# Patient Record
Sex: Female | Born: 1960 | Race: White | Hispanic: No | Marital: Married | Smoking: Never smoker
Health system: Southern US, Community
[De-identification: ages and names within clinical notes are randomized; demographics above are authoritative.]

---

## 2004-11-30 ENCOUNTER — Other Ambulatory Visit: Admission: RE | Admit: 2004-11-30 | Discharge: 2004-11-30 | Payer: Self-pay | Admitting: Obstetrics and Gynecology

## 2004-12-05 ENCOUNTER — Ambulatory Visit (HOSPITAL_COMMUNITY): Admission: RE | Admit: 2004-12-05 | Discharge: 2004-12-05 | Payer: Self-pay | Admitting: Obstetrics and Gynecology

## 2007-07-01 ENCOUNTER — Ambulatory Visit (HOSPITAL_COMMUNITY): Admission: RE | Admit: 2007-07-01 | Discharge: 2007-07-01 | Payer: Self-pay | Admitting: Obstetrics and Gynecology

## 2009-02-08 ENCOUNTER — Other Ambulatory Visit: Admission: RE | Admit: 2009-02-08 | Discharge: 2009-02-08 | Payer: Self-pay | Admitting: Obstetrics and Gynecology

## 2009-02-09 ENCOUNTER — Encounter: Admission: RE | Admit: 2009-02-09 | Discharge: 2009-02-09 | Payer: Self-pay | Admitting: Obstetrics and Gynecology

## 2010-02-13 ENCOUNTER — Other Ambulatory Visit: Admission: RE | Admit: 2010-02-13 | Discharge: 2010-02-13 | Payer: Self-pay | Admitting: Obstetrics and Gynecology

## 2010-03-09 ENCOUNTER — Ambulatory Visit (HOSPITAL_COMMUNITY): Admission: RE | Admit: 2010-03-09 | Discharge: 2010-03-09 | Payer: Self-pay | Admitting: Obstetrics and Gynecology

## 2010-04-12 ENCOUNTER — Ambulatory Visit: Payer: Self-pay | Admitting: Sports Medicine

## 2010-04-12 DIAGNOSIS — M7660 Achilles tendinitis, unspecified leg: Secondary | ICD-10-CM | POA: Insufficient documentation

## 2010-09-05 NOTE — Assessment & Plan Note (Signed)
Summary: NP RUNNER L  ACHILLES INJURY X JULY   Vital Signs:  Patient profile:   50 year old female Height:      68 inches Weight:      135 pounds BMI:     20.60 BP sitting:   133 / 84  Vitals Entered By: Lillia Pauls CMA (April 12, 2010 2:55 PM)  History of Present Illness: RT leg AT about 6 mos ago and took 12 wks of exercises to resolve Within 6 wks had some RT PF pain that took 2 mos to settle down  Runs 20 to 30 mpw Bikes - 7 to 8 hours  Mid to late jul had left AT pain now pain is mild coming to see me to see why more injuries w no change in training  Physical Exam  General:  Well-developed,well-nourished,in no acute distress; alert,appropriate and cooperative throughout examination Msk:  Exc quad. HS and calf strength both AT are normal and nontender to palpation mild foot pronation but more over great toe some 1 mtp joint change bilat  weak hip abductors/ good flex and rotation  gait dynamic pronation of fore and mid foot dynamic genu valgum is mild  improved in sports insole with heel lift   Impression & Recommendations:  Problem # 1:  ACHILLES TENDINITIS, BILATERAL (ICD-726.71)  Mild on both sides at present  I think this is trgigered by biomechanics - needs abdcution exercises and a program given for this also nees some arch support to block pronation given sports insolse  reck 6 wks and see is strength gain noted  restart AT exercises  Orders: Sports Insoles ([id number])

## 2011-04-11 ENCOUNTER — Other Ambulatory Visit (HOSPITAL_COMMUNITY): Payer: Self-pay | Admitting: Obstetrics and Gynecology

## 2011-04-11 DIAGNOSIS — Z1231 Encounter for screening mammogram for malignant neoplasm of breast: Secondary | ICD-10-CM

## 2011-04-13 ENCOUNTER — Ambulatory Visit (HOSPITAL_COMMUNITY)
Admission: RE | Admit: 2011-04-13 | Discharge: 2011-04-13 | Disposition: A | Discharge status: Home / Self Care | Payer: BC Managed Care – PPO | Source: Ambulatory Visit | Attending: Obstetrics and Gynecology | Admitting: Obstetrics and Gynecology

## 2011-04-13 DIAGNOSIS — Z1231 Encounter for screening mammogram for malignant neoplasm of breast: Secondary | ICD-10-CM | POA: Insufficient documentation

## 2011-04-17 ENCOUNTER — Other Ambulatory Visit: Payer: Self-pay | Admitting: Obstetrics and Gynecology

## 2011-04-17 DIAGNOSIS — R928 Other abnormal and inconclusive findings on diagnostic imaging of breast: Secondary | ICD-10-CM

## 2011-04-25 ENCOUNTER — Ambulatory Visit
Admission: RE | Admit: 2011-04-25 | Discharge: 2011-04-25 | Disposition: A | Discharge status: Home / Self Care | Payer: BC Managed Care – PPO | Source: Ambulatory Visit | Attending: Obstetrics and Gynecology | Admitting: Obstetrics and Gynecology

## 2011-04-25 DIAGNOSIS — R928 Other abnormal and inconclusive findings on diagnostic imaging of breast: Secondary | ICD-10-CM

## 2011-10-05 ENCOUNTER — Other Ambulatory Visit: Payer: Self-pay | Admitting: Obstetrics and Gynecology

## 2011-10-05 DIAGNOSIS — N6089 Other benign mammary dysplasias of unspecified breast: Secondary | ICD-10-CM

## 2011-10-17 ENCOUNTER — Other Ambulatory Visit: Payer: Self-pay | Admitting: Obstetrics and Gynecology

## 2011-10-17 ENCOUNTER — Ambulatory Visit
Admission: RE | Admit: 2011-10-17 | Discharge: 2011-10-17 | Disposition: A | Discharge status: Home / Self Care | Payer: BC Managed Care – PPO | Source: Ambulatory Visit | Attending: Obstetrics and Gynecology | Admitting: Obstetrics and Gynecology

## 2011-10-17 DIAGNOSIS — N6089 Other benign mammary dysplasias of unspecified breast: Secondary | ICD-10-CM

## 2012-02-19 ENCOUNTER — Other Ambulatory Visit: Payer: Self-pay | Admitting: Obstetrics and Gynecology

## 2012-02-19 ENCOUNTER — Other Ambulatory Visit (HOSPITAL_COMMUNITY)
Admission: RE | Admit: 2012-02-19 | Discharge: 2012-02-19 | Disposition: A | Discharge status: Home / Self Care | Payer: BC Managed Care – PPO | Source: Ambulatory Visit | Attending: Obstetrics and Gynecology | Admitting: Obstetrics and Gynecology

## 2012-02-19 DIAGNOSIS — Z01419 Encounter for gynecological examination (general) (routine) without abnormal findings: Secondary | ICD-10-CM | POA: Insufficient documentation

## 2012-02-19 DIAGNOSIS — Z1159 Encounter for screening for other viral diseases: Secondary | ICD-10-CM | POA: Insufficient documentation

## 2013-02-23 ENCOUNTER — Other Ambulatory Visit: Payer: Self-pay | Admitting: Obstetrics and Gynecology

## 2013-02-23 ENCOUNTER — Other Ambulatory Visit (HOSPITAL_COMMUNITY)
Admission: RE | Admit: 2013-02-23 | Discharge: 2013-02-23 | Disposition: A | Discharge status: Home / Self Care | Payer: Managed Care, Other (non HMO) | Source: Ambulatory Visit | Attending: Obstetrics and Gynecology | Admitting: Obstetrics and Gynecology

## 2013-02-23 DIAGNOSIS — Z01419 Encounter for gynecological examination (general) (routine) without abnormal findings: Secondary | ICD-10-CM | POA: Insufficient documentation

## 2013-02-26 ENCOUNTER — Other Ambulatory Visit: Payer: Self-pay | Admitting: Obstetrics and Gynecology

## 2013-02-26 DIAGNOSIS — E041 Nontoxic single thyroid nodule: Secondary | ICD-10-CM

## 2013-03-03 ENCOUNTER — Ambulatory Visit
Admission: RE | Admit: 2013-03-03 | Discharge: 2013-03-03 | Disposition: A | Discharge status: Home / Self Care | Payer: Managed Care, Other (non HMO) | Source: Ambulatory Visit | Attending: Obstetrics and Gynecology | Admitting: Obstetrics and Gynecology

## 2013-03-03 DIAGNOSIS — E041 Nontoxic single thyroid nodule: Secondary | ICD-10-CM

## 2013-08-26 ENCOUNTER — Other Ambulatory Visit: Payer: Self-pay | Admitting: Gastroenterology

## 2013-08-28 ENCOUNTER — Other Ambulatory Visit (HOSPITAL_COMMUNITY): Payer: Self-pay | Admitting: Obstetrics and Gynecology

## 2013-08-28 DIAGNOSIS — Z1231 Encounter for screening mammogram for malignant neoplasm of breast: Secondary | ICD-10-CM

## 2013-09-03 ENCOUNTER — Ambulatory Visit (HOSPITAL_COMMUNITY)
Admission: RE | Admit: 2013-09-03 | Discharge: 2013-09-03 | Disposition: A | Discharge status: Home / Self Care | Payer: Managed Care, Other (non HMO) | Source: Ambulatory Visit | Attending: Obstetrics and Gynecology | Admitting: Obstetrics and Gynecology

## 2013-09-03 ENCOUNTER — Other Ambulatory Visit (HOSPITAL_COMMUNITY): Payer: Self-pay | Admitting: Obstetrics and Gynecology

## 2013-09-03 DIAGNOSIS — Z1231 Encounter for screening mammogram for malignant neoplasm of breast: Secondary | ICD-10-CM

## 2014-02-23 ENCOUNTER — Other Ambulatory Visit: Payer: Self-pay | Admitting: Obstetrics and Gynecology

## 2014-02-23 ENCOUNTER — Other Ambulatory Visit (HOSPITAL_COMMUNITY)
Admission: RE | Admit: 2014-02-23 | Discharge: 2014-02-23 | Disposition: A | Discharge status: Home / Self Care | Payer: Managed Care, Other (non HMO) | Source: Ambulatory Visit | Attending: Obstetrics and Gynecology | Admitting: Obstetrics and Gynecology

## 2014-02-23 DIAGNOSIS — Z01419 Encounter for gynecological examination (general) (routine) without abnormal findings: Secondary | ICD-10-CM | POA: Insufficient documentation

## 2014-02-25 LAB — CYTOLOGY - PAP

## 2015-01-21 ENCOUNTER — Other Ambulatory Visit (HOSPITAL_COMMUNITY): Payer: Self-pay | Admitting: Obstetrics and Gynecology

## 2015-01-21 DIAGNOSIS — Z1231 Encounter for screening mammogram for malignant neoplasm of breast: Secondary | ICD-10-CM

## 2015-02-01 ENCOUNTER — Ambulatory Visit (HOSPITAL_COMMUNITY)
Admission: RE | Admit: 2015-02-01 | Discharge: 2015-02-01 | Disposition: A | Discharge status: Home / Self Care | Payer: Managed Care, Other (non HMO) | Source: Ambulatory Visit | Attending: Obstetrics and Gynecology | Admitting: Obstetrics and Gynecology

## 2015-02-01 ENCOUNTER — Other Ambulatory Visit (HOSPITAL_COMMUNITY): Payer: Self-pay | Admitting: Obstetrics and Gynecology

## 2015-02-01 DIAGNOSIS — Z1231 Encounter for screening mammogram for malignant neoplasm of breast: Secondary | ICD-10-CM | POA: Diagnosis present

## 2015-02-23 ENCOUNTER — Other Ambulatory Visit: Payer: Self-pay | Admitting: Obstetrics and Gynecology

## 2015-02-23 ENCOUNTER — Other Ambulatory Visit (HOSPITAL_COMMUNITY)
Admission: RE | Admit: 2015-02-23 | Discharge: 2015-02-23 | Disposition: A | Discharge status: Home / Self Care | Payer: Managed Care, Other (non HMO) | Source: Ambulatory Visit | Attending: Obstetrics and Gynecology | Admitting: Obstetrics and Gynecology

## 2015-02-23 DIAGNOSIS — Z1151 Encounter for screening for human papillomavirus (HPV): Secondary | ICD-10-CM | POA: Diagnosis present

## 2015-02-23 DIAGNOSIS — Z01419 Encounter for gynecological examination (general) (routine) without abnormal findings: Secondary | ICD-10-CM | POA: Diagnosis not present

## 2015-02-25 LAB — CYTOLOGY - PAP

## 2015-04-03 ENCOUNTER — Encounter (HOSPITAL_COMMUNITY): Payer: Self-pay | Admitting: Nurse Practitioner

## 2015-04-03 ENCOUNTER — Emergency Department (HOSPITAL_COMMUNITY)
Admission: EM | Admit: 2015-04-03 | Discharge: 2015-04-03 | Disposition: A | Discharge status: Home / Self Care | Payer: Managed Care, Other (non HMO) | Attending: Emergency Medicine | Admitting: Emergency Medicine

## 2015-04-03 ENCOUNTER — Emergency Department (HOSPITAL_COMMUNITY): Payer: Managed Care, Other (non HMO)

## 2015-04-03 DIAGNOSIS — Y9289 Other specified places as the place of occurrence of the external cause: Secondary | ICD-10-CM | POA: Insufficient documentation

## 2015-04-03 DIAGNOSIS — X58XXXA Exposure to other specified factors, initial encounter: Secondary | ICD-10-CM | POA: Insufficient documentation

## 2015-04-03 DIAGNOSIS — Y9389 Activity, other specified: Secondary | ICD-10-CM | POA: Insufficient documentation

## 2015-04-03 DIAGNOSIS — M25462 Effusion, left knee: Secondary | ICD-10-CM

## 2015-04-03 DIAGNOSIS — Y998 Other external cause status: Secondary | ICD-10-CM | POA: Insufficient documentation

## 2015-04-03 DIAGNOSIS — M25562 Pain in left knee: Secondary | ICD-10-CM

## 2015-04-03 DIAGNOSIS — S8992XA Unspecified injury of left lower leg, initial encounter: Secondary | ICD-10-CM | POA: Diagnosis present

## 2015-04-03 MED ORDER — HYDROCODONE-ACETAMINOPHEN 5-325 MG PO TABS
1.0000 | ORAL_TABLET | Freq: Once | ORAL | Status: AC
  Administered 2015-04-03: 1 via ORAL
  Filled 2015-04-03: qty 1

## 2015-04-03 MED ORDER — HYDROCODONE-ACETAMINOPHEN 5-325 MG PO TABS: 1.0000 | ORAL_TABLET | ORAL | Status: AC | PRN

## 2015-04-03 NOTE — ED Notes (Signed)
Pt reports L KNEE PAIN over past week since playing badminton. Then today she was going up stairs and felt a pop in her L knee and has had severe pain since.

## 2015-04-03 NOTE — Discharge Instructions (Signed)
Read the information below.  Use the prescribed medication as directed.  Please discuss all new medications with your pharmacist.  Do not take additional tylenol while taking the prescribed pain medication to avoid overdose.  You may return to the Emergency Department at any time for worsening condition or any new symptoms that concern you.  If you develop uncontrolled pain, weakness or numbness of the extremity, severe discoloration of the skin, or you are unable to walk or move your toes, return to the ER for a recheck.   ° ° °Knee Pain °The knee is the complex joint between your thigh and your lower leg. It is made up of bones, tendons, ligaments, and cartilage. The bones that make up the knee are: °· The femur in the thigh. °· The tibia and fibula in the lower leg. °· The patella or kneecap riding in the groove on the lower femur. °CAUSES  °Knee pain is a common complaint with many causes. A few of these causes are: °· Injury, such as: °¨ A ruptured ligament or tendon injury. °¨ Torn cartilage. °· Medical conditions, such as: °¨ Gout °¨ Arthritis °¨ Infections °· Overuse, over training, or overdoing a physical activity. °Knee pain can be minor or severe. Knee pain can accompany debilitating injury. Minor knee problems often respond well to self-care measures or get well on their own. More serious injuries may need medical intervention or even surgery. °SYMPTOMS °The knee is complex. Symptoms of knee problems can vary widely. Some of the problems are: °· Pain with movement and weight bearing. °· Swelling and tenderness. °· Buckling of the knee. °· Inability to straighten or extend your knee. °· Your knee locks and you cannot straighten it. °· Warmth and redness with pain and fever. °· Deformity or dislocation of the kneecap. °DIAGNOSIS  °Determining what is wrong may be very straight forward such as when there is an injury. It can also be challenging because of the complexity of the knee. Tests to make a  diagnosis may include: °· Your caregiver taking a history and doing a physical exam. °· Routine X-rays can be used to rule out other problems. X-rays will not reveal a cartilage tear. Some injuries of the knee can be diagnosed by: °¨ Arthroscopy a surgical technique by which a small video camera is inserted through tiny incisions on the sides of the knee. This procedure is used to examine and repair internal knee joint problems. Tiny instruments can be used during arthroscopy to repair the torn knee cartilage (meniscus). °¨ Arthrography is a radiology technique. A contrast liquid is directly injected into the knee joint. Internal structures of the knee joint then become visible on X-ray film. °¨ An MRI scan is a non X-ray radiology procedure in which magnetic fields and a computer produce two- or three-dimensional images of the inside of the knee. Cartilage tears are often visible using an MRI scanner. MRI scans have largely replaced arthrography in diagnosing cartilage tears of the knee. °· Blood work. °· Examination of the fluid that helps to lubricate the knee joint (synovial fluid). This is done by taking a sample out using a needle and a syringe. °TREATMENT °The treatment of knee problems depends on the cause. Some of these treatments are: °· Depending on the injury, proper casting, splinting, surgery, or physical therapy care will be needed. °· Give yourself adequate recovery time. Do not overuse your joints. If you begin to get sore during workout routines, back off. Slow down or do fewer   repetitions. °· For repetitive activities such as cycling or running, maintain your strength and nutrition. °· Alternate muscle groups. For example, if you are a weight lifter, work the upper body on one day and the lower body the next. °· Either tight or weak muscles do not give the proper support for your knee. Tight or weak muscles do not absorb the stress placed on the knee joint. Keep the muscles surrounding the knee  strong. °· Take care of mechanical problems. °¨ If you have flat feet, orthotics or special shoes may help. See your caregiver if you need help. °¨ Arch supports, sometimes with wedges on the inner or outer aspect of the heel, can help. These can shift pressure away from the side of the knee most bothered by osteoarthritis. °¨ A brace called an "unloader" brace also may be used to help ease the pressure on the most arthritic side of the knee. °· If your caregiver has prescribed crutches, braces, wraps or ice, use as directed. The acronym for this is PRICE. This means protection, rest, ice, compression, and elevation. °· Nonsteroidal anti-inflammatory drugs (NSAIDs), can help relieve pain. But if taken immediately after an injury, they may actually increase swelling. Take NSAIDs with food in your stomach. Stop them if you develop stomach problems. Do not take these if you have a history of ulcers, stomach pain, or bleeding from the bowel. Do not take without your caregiver's approval if you have problems with fluid retention, heart failure, or kidney problems. °· For ongoing knee problems, physical therapy may be helpful. °· Glucosamine and chondroitin are over-the-counter dietary supplements. Both may help relieve the pain of osteoarthritis in the knee. These medicines are different from the usual anti-inflammatory drugs. Glucosamine may decrease the rate of cartilage destruction. °· Injections of a corticosteroid drug into your knee joint may help reduce the symptoms of an arthritis flare-up. They may provide pain relief that lasts a few months. You may have to wait a few months between injections. The injections do have a small increased risk of infection, water retention, and elevated blood sugar levels. °· Hyaluronic acid injected into damaged joints may ease pain and provide lubrication. These injections may work by reducing inflammation. A series of shots may give relief for as long as 6 months. °· Topical  painkillers. Applying certain ointments to your skin may help relieve the pain and stiffness of osteoarthritis. Ask your pharmacist for suggestions. Many over the-counter products are approved for temporary relief of arthritis pain. °· In some countries, doctors often prescribe topical NSAIDs for relief of chronic conditions such as arthritis and tendinitis. A review of treatment with NSAID creams found that they worked as well as oral medications but without the serious side effects. °PREVENTION °· Maintain a healthy weight. Extra pounds put more strain on your joints. °· Get strong, stay limber. Weak muscles are a common cause of knee injuries. Stretching is important. Include flexibility exercises in your workouts. °· Be smart about exercise. If you have osteoarthritis, chronic knee pain or recurring injuries, you may need to change the way you exercise. This does not mean you have to stop being active. If your knees ache after jogging or playing basketball, consider switching to swimming, water aerobics, or other low-impact activities, at least for a few days a week. Sometimes limiting high-impact activities will provide relief. °· Make sure your shoes fit well. Choose footwear that is right for your sport. °· Protect your knees. Use the proper gear for knee-sensitive   activities. Use kneepads when playing volleyball or laying carpet. Buckle your seat belt every time you drive. Most shattered kneecaps occur in car accidents. °· Rest when you are tired. °SEEK MEDICAL CARE IF:  °You have knee pain that is continual and does not seem to be getting better.  °SEEK IMMEDIATE MEDICAL CARE IF:  °Your knee joint feels hot to the touch and you have a high fever. °MAKE SURE YOU:  °· Understand these instructions. °· Will watch your condition. °· Will get help right away if you are not doing well or get worse. °Document Released: 05/20/2007 Document Revised: 10/15/2011 Document Reviewed: 05/20/2007 °[employer]® Patient  Information ©2015 [employer]. This information is not intended to replace advice given to you by your health care provider. Make sure you discuss any questions you have with your health care provider. ° °

## 2015-04-03 NOTE — ED Provider Notes (Signed)
CSN: [id number]   Arrival date & time 04/03/15 1316  History  This chart was scribed for non-physician practitioner, Trixie Dredge PA-C, working with Marily Memos, MD by Bethel Born, ED Scribe. This patient was seen in room TR11C/TR11C and the patient's care was started at 3:06 PM.  Chief Complaint  Patient presents with  . Knee Pain    HPI The history is provided by the patient. No language interpreter was used.   Alicia Hernandez is a 54 y.o. female who presents to the Emergency Department complaining of pain in the left knee with pop while going up stairs today.  States she has had mild  left knee pain  with onset 2 weeks ago after playing badminton. She does not remember injuring the knee at that time and notes that she was able to continue her usual activities including running and biking. She exercises regularly, often several hours at a time.  The pain worsened significantly today coming up a flight of stairs with her bike. While climbing the stairs she heard and felt a "pop" from the knee. The pain has been sore and tight and is worse with movement.  Concerned about an ACL tear.  Associated symptoms include a brief episode of nausea when the pain worsened. Pt denies LE weakness, numbness, fever, chills, current nausea, and abdominal pain.  Denies any other injury.   History reviewed. No pertinent past medical history.  History reviewed. No pertinent past surgical history.  History reviewed. No pertinent family history.  Social History  Substance Use Topics  . Smoking status: Never Smoker   . Smokeless tobacco: None  . Alcohol Use: Yes     Review of Systems  Constitutional: Negative for fever and chills.  Gastrointestinal: Positive for nausea (Resolved). Negative for vomiting and abdominal pain.  Musculoskeletal: Positive for arthralgias.       Left knee pain  Skin: Negative for color change and wound.  Allergic/Immunologic: Negative for immunocompromised state.  Neurological:  Negative for weakness.  Hematological: Does not bruise/bleed easily.  Psychiatric/Behavioral: Negative for self-injury (accidental).    Home Medications   Prior to Admission medications   Not on File    Allergies  Review of patient's allergies indicates no known allergies.  Triage Vitals: BP 113/68 mmHg  Pulse 56  Temp(Src) 98.1 F (36.7 C) (Oral)  Resp 16  Ht  (1.727 m)  Wt 140 lb (63.504 kg)  BMI 21.29 kg/m2  SpO2 97%  Physical Exam  Constitutional: She appears well-developed and well-nourished. No distress.  HENT:  Head: Normocephalic and atraumatic.  Neck: Neck supple.  Pulmonary/Chest: Effort normal.  Musculoskeletal:       Left knee: She exhibits normal range of motion, no swelling, no ecchymosis, no deformity, no laceration, no erythema, normal alignment, no LCL laxity, no bony tenderness and no MCL laxity. No tenderness found.  Left knee: Significant pain with anterior stress /anterior drawer test No pain with valgus/varus stressing or posterior drawer test No laxity Distal pulses and sensation intact   Neurological: She is alert.  Skin: She is not diaphoretic.  Nursing note and vitals reviewed.   ED Course  Procedures  DIAGNOSTIC STUDIES: Oxygen Saturation is 97% on RA,  normal by my interpretation.    COORDINATION OF CARE: 3:25 PM Discussed treatment plan which includes left knee XR and immobilization with pt at bedside and pt agreed to plan.  Labs Review- Labs Reviewed - No data to display  Imaging Review Dg Knee Complete 4 Views Left  04/03/2015   CLINICAL DATA:  LEFT knee pain for 1 week, began to hurt after playing soccer but no specific injury, heard a pop in LEFT knee when walking up stairs today and unable to bear weight since, lateral pain, initial encounter  EXAM: LEFT KNEE - COMPLETE 4+ VIEW  COMPARISON:  None  FINDINGS: Osseous mineralization normal.  Joint spaces preserved.  No acute fracture, dislocation, or bone destruction.  Small  knee joint effusion.  IMPRESSION: Knee joint effusion without acute bony abnormality.   Electronically Signed   By: Ulyses Southward M.D.   On: 04/03/2015 15:11    EKG Interpretation None      MDM   Final diagnoses:  Left knee pain  Knee effusion, left   Afebrile, nontoxic patient with injury to her left knee while walking up stairs.  Associated pop.   Xray with small effusion.  She does have significant pain with anterior drawer testing only.  Concern for ACL injury.  Placed in knee immobilizer.  Pt has her own crutches.   D/C home with close orthopedic follow up, norco, knee immobilizer.  Discussed result, findings, treatment, and follow up  with patient.  Pt given return precautions.  Pt verbalizes understanding and agrees with plan.      I personally performed the services described in this documentation, which was scribed in my presence. The recorded information has been reviewed and is accurate.      Trixie Dredge, PA-C 04/03/15 1651  Marily Memos, MD 04/04/15 1030

## 2016-01-13 ENCOUNTER — Other Ambulatory Visit: Payer: Self-pay | Admitting: Obstetrics and Gynecology

## 2016-01-13 DIAGNOSIS — Z1231 Encounter for screening mammogram for malignant neoplasm of breast: Secondary | ICD-10-CM

## 2016-02-14 ENCOUNTER — Ambulatory Visit
Admission: RE | Admit: 2016-02-14 | Discharge: 2016-02-14 | Disposition: A | Discharge status: Home / Self Care | Payer: Managed Care, Other (non HMO) | Source: Ambulatory Visit | Attending: Obstetrics and Gynecology | Admitting: Obstetrics and Gynecology

## 2016-02-14 DIAGNOSIS — Z1231 Encounter for screening mammogram for malignant neoplasm of breast: Secondary | ICD-10-CM

## 2016-02-23 ENCOUNTER — Other Ambulatory Visit: Payer: Self-pay | Admitting: Obstetrics and Gynecology

## 2016-02-23 ENCOUNTER — Other Ambulatory Visit (HOSPITAL_COMMUNITY)
Admission: RE | Admit: 2016-02-23 | Discharge: 2016-02-23 | Disposition: A | Discharge status: Home / Self Care | Payer: Managed Care, Other (non HMO) | Source: Ambulatory Visit | Attending: Obstetrics and Gynecology | Admitting: Obstetrics and Gynecology

## 2016-02-23 DIAGNOSIS — Z01411 Encounter for gynecological examination (general) (routine) with abnormal findings: Secondary | ICD-10-CM | POA: Insufficient documentation

## 2016-02-24 LAB — CYTOLOGY - PAP

## 2017-04-15 ENCOUNTER — Other Ambulatory Visit: Payer: Self-pay | Admitting: Obstetrics and Gynecology

## 2017-04-15 DIAGNOSIS — Z1231 Encounter for screening mammogram for malignant neoplasm of breast: Secondary | ICD-10-CM

## 2017-04-29 ENCOUNTER — Ambulatory Visit
Admission: RE | Admit: 2017-04-29 | Discharge: 2017-04-29 | Disposition: A | Discharge status: Home / Self Care | Payer: Managed Care, Other (non HMO) | Source: Ambulatory Visit | Attending: Obstetrics and Gynecology | Admitting: Obstetrics and Gynecology

## 2017-04-29 DIAGNOSIS — Z1231 Encounter for screening mammogram for malignant neoplasm of breast: Secondary | ICD-10-CM

## 2018-04-01 ENCOUNTER — Other Ambulatory Visit: Payer: Self-pay | Admitting: Obstetrics and Gynecology

## 2018-04-01 DIAGNOSIS — Z1231 Encounter for screening mammogram for malignant neoplasm of breast: Secondary | ICD-10-CM

## 2018-05-01 ENCOUNTER — Ambulatory Visit
Admission: RE | Admit: 2018-05-01 | Discharge: 2018-05-01 | Disposition: A | Discharge status: Home / Self Care | Payer: Managed Care, Other (non HMO) | Source: Ambulatory Visit | Attending: Obstetrics and Gynecology | Admitting: Obstetrics and Gynecology

## 2018-05-01 DIAGNOSIS — Z1231 Encounter for screening mammogram for malignant neoplasm of breast: Secondary | ICD-10-CM

## 2018-05-05 ENCOUNTER — Other Ambulatory Visit: Payer: Self-pay | Admitting: Obstetrics and Gynecology

## 2018-05-05 ENCOUNTER — Other Ambulatory Visit (HOSPITAL_COMMUNITY)
Admission: RE | Admit: 2018-05-05 | Discharge: 2018-05-05 | Disposition: A | Discharge status: Home / Self Care | Payer: Managed Care, Other (non HMO) | Source: Ambulatory Visit | Attending: Obstetrics and Gynecology | Admitting: Obstetrics and Gynecology

## 2018-05-05 DIAGNOSIS — Z01419 Encounter for gynecological examination (general) (routine) without abnormal findings: Secondary | ICD-10-CM | POA: Insufficient documentation

## 2018-05-06 LAB — CYTOLOGY - PAP
Diagnosis: NEGATIVE
HPV: NOT DETECTED

## 2019-06-08 ENCOUNTER — Other Ambulatory Visit: Payer: Self-pay | Admitting: Obstetrics and Gynecology

## 2019-06-08 DIAGNOSIS — Z1231 Encounter for screening mammogram for malignant neoplasm of breast: Secondary | ICD-10-CM

## 2019-06-25 ENCOUNTER — Ambulatory Visit
Admission: RE | Admit: 2019-06-25 | Discharge: 2019-06-25 | Disposition: A | Discharge status: Home / Self Care | Payer: No Typology Code available for payment source | Source: Ambulatory Visit | Attending: Obstetrics and Gynecology | Admitting: Obstetrics and Gynecology

## 2019-06-25 ENCOUNTER — Other Ambulatory Visit: Payer: Self-pay

## 2019-06-25 DIAGNOSIS — Z1231 Encounter for screening mammogram for malignant neoplasm of breast: Secondary | ICD-10-CM

## 2020-05-12 ENCOUNTER — Other Ambulatory Visit: Payer: Self-pay | Admitting: Obstetrics and Gynecology

## 2020-05-12 DIAGNOSIS — Z1231 Encounter for screening mammogram for malignant neoplasm of breast: Secondary | ICD-10-CM

## 2020-06-27 ENCOUNTER — Other Ambulatory Visit: Payer: Self-pay

## 2020-06-27 ENCOUNTER — Ambulatory Visit: Payer: No Typology Code available for payment source

## 2020-08-10 ENCOUNTER — Other Ambulatory Visit: Payer: No Typology Code available for payment source

## 2020-08-12 ENCOUNTER — Other Ambulatory Visit: Payer: No Typology Code available for payment source

## 2020-08-12 ENCOUNTER — Other Ambulatory Visit: Payer: Self-pay

## 2020-08-12 DIAGNOSIS — Z20822 Contact with and (suspected) exposure to covid-19: Secondary | ICD-10-CM

## 2020-08-15 LAB — NOVEL CORONAVIRUS, NAA: SARS-CoV-2, NAA: NOT DETECTED

## 2020-08-17 ENCOUNTER — Ambulatory Visit
Admission: RE | Admit: 2020-08-17 | Discharge: 2020-08-17 | Disposition: A | Discharge status: Home / Self Care | Payer: No Typology Code available for payment source | Source: Ambulatory Visit | Attending: Obstetrics and Gynecology | Admitting: Obstetrics and Gynecology

## 2020-08-17 ENCOUNTER — Other Ambulatory Visit: Payer: Self-pay

## 2020-08-17 DIAGNOSIS — Z1231 Encounter for screening mammogram for malignant neoplasm of breast: Secondary | ICD-10-CM

## 2021-07-05 ENCOUNTER — Other Ambulatory Visit: Payer: Self-pay | Admitting: Obstetrics and Gynecology

## 2021-07-05 DIAGNOSIS — Z1231 Encounter for screening mammogram for malignant neoplasm of breast: Secondary | ICD-10-CM

## 2021-08-18 ENCOUNTER — Ambulatory Visit
Admission: RE | Admit: 2021-08-18 | Discharge: 2021-08-18 | Disposition: A | Discharge status: Home / Self Care | Payer: No Typology Code available for payment source | Source: Ambulatory Visit

## 2021-08-18 DIAGNOSIS — Z1231 Encounter for screening mammogram for malignant neoplasm of breast: Secondary | ICD-10-CM

## 2022-08-21 ENCOUNTER — Other Ambulatory Visit: Payer: Self-pay | Admitting: Obstetrics and Gynecology

## 2022-08-21 DIAGNOSIS — Z1231 Encounter for screening mammogram for malignant neoplasm of breast: Secondary | ICD-10-CM

## 2022-08-24 ENCOUNTER — Ambulatory Visit
Admission: RE | Admit: 2022-08-24 | Discharge: 2022-08-24 | Disposition: A | Discharge status: Home / Self Care | Payer: No Typology Code available for payment source | Source: Ambulatory Visit

## 2022-08-24 DIAGNOSIS — Z1231 Encounter for screening mammogram for malignant neoplasm of breast: Secondary | ICD-10-CM

## 2023-07-12 ENCOUNTER — Other Ambulatory Visit: Payer: Self-pay | Admitting: Obstetrics and Gynecology

## 2023-07-12 DIAGNOSIS — Z1231 Encounter for screening mammogram for malignant neoplasm of breast: Secondary | ICD-10-CM

## 2023-08-27 ENCOUNTER — Other Ambulatory Visit: Payer: Self-pay | Admitting: Nurse Practitioner

## 2023-08-27 ENCOUNTER — Other Ambulatory Visit (HOSPITAL_COMMUNITY)
Admission: RE | Admit: 2023-08-27 | Discharge: 2023-08-27 | Disposition: A | Discharge status: Home / Self Care | Payer: Commercial Managed Care - PPO | Source: Ambulatory Visit | Attending: Nurse Practitioner | Admitting: Nurse Practitioner

## 2023-08-27 DIAGNOSIS — Z124 Encounter for screening for malignant neoplasm of cervix: Secondary | ICD-10-CM | POA: Diagnosis present

## 2023-08-28 ENCOUNTER — Encounter: Payer: Self-pay | Admitting: Radiology

## 2023-08-28 ENCOUNTER — Ambulatory Visit
Admission: RE | Admit: 2023-08-28 | Discharge: 2023-08-28 | Disposition: A | Discharge status: Home / Self Care | Payer: Commercial Managed Care - PPO | Source: Ambulatory Visit

## 2023-08-28 DIAGNOSIS — Z1231 Encounter for screening mammogram for malignant neoplasm of breast: Secondary | ICD-10-CM

## 2023-08-29 LAB — CYTOLOGY - PAP
Comment: NEGATIVE
Diagnosis: NEGATIVE
High risk HPV: NEGATIVE

## 2023-09-11 IMAGING — MG MM DIGITAL SCREENING BILAT W/ TOMO AND CAD
8 series · 9 of 24 positions shown · non-contrast
Comparison: Previous exam(s).

CLINICAL DATA: Screening.

EXAM:
DIGITAL SCREENING BILATERAL MAMMOGRAM WITH TOMOSYNTHESIS AND CAD
TECHNIQUE: Bilateral screening digital craniocaudal and mediolateral oblique
mammograms were obtained. Bilateral screening digital breast
tomosynthesis was performed. The images were evaluated with
computer-aided detection.

[R MLO synth-2D]
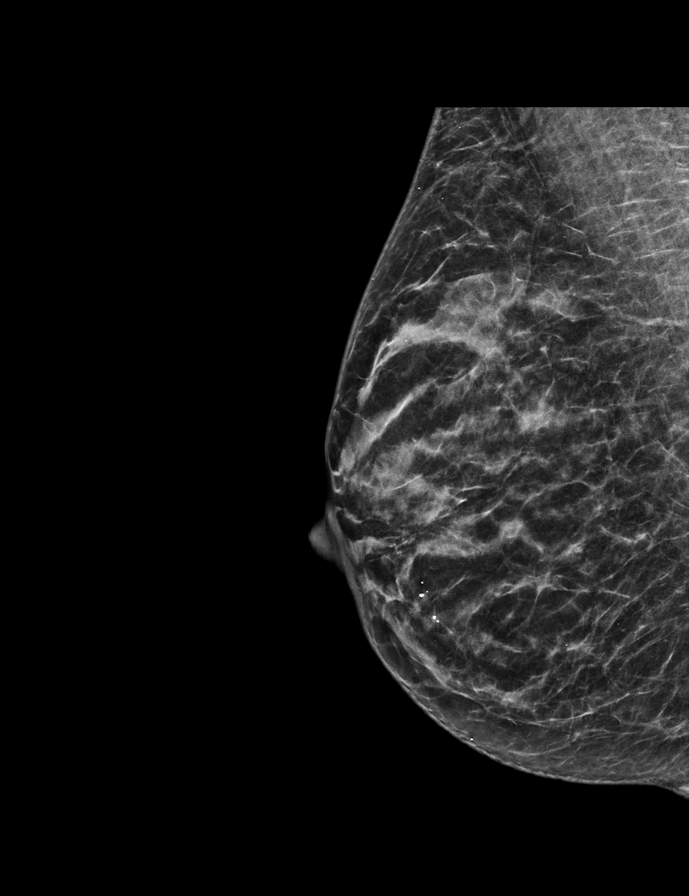

[L CC synth-2D]
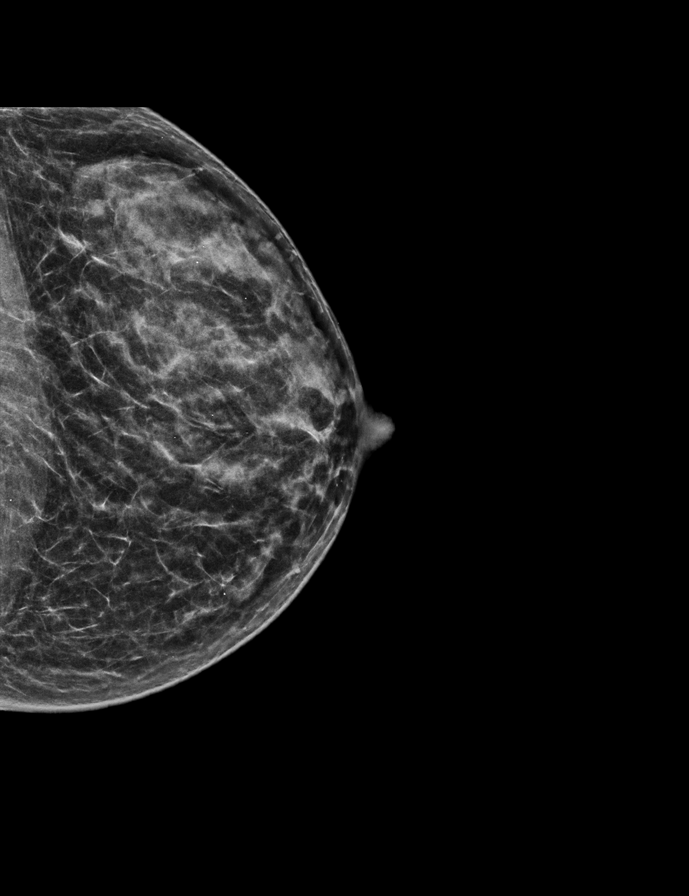

[R CC synth-2D]
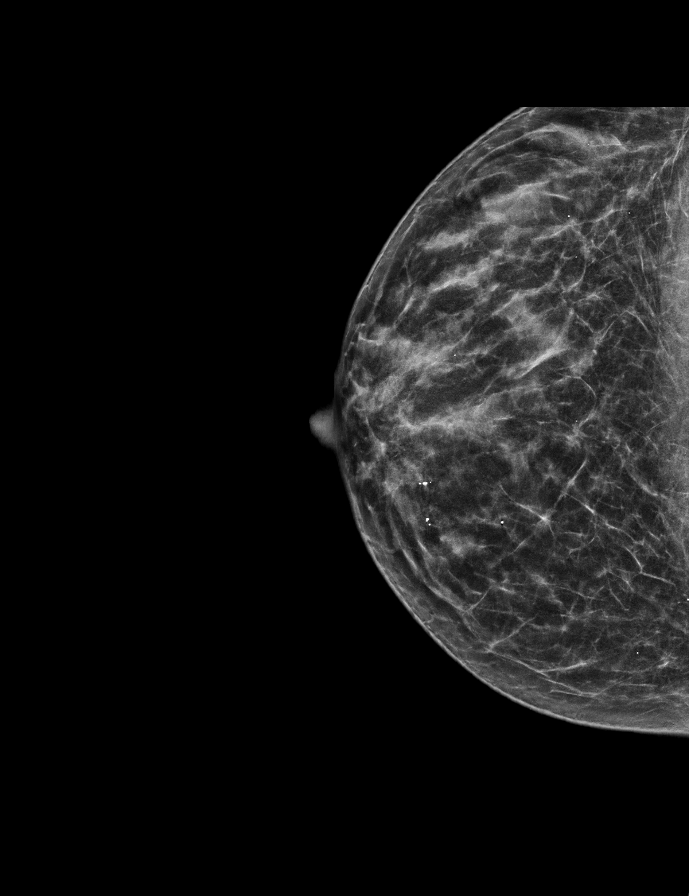

[L MLO synth-2D]
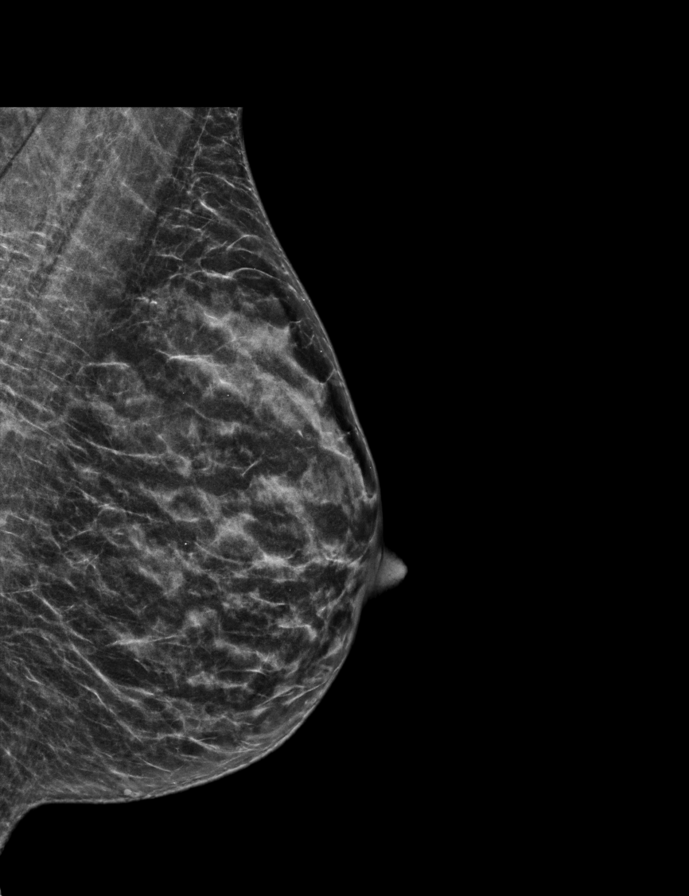

[R MLO tomo · 2 of 46 frames shown]
[frame 15/46]
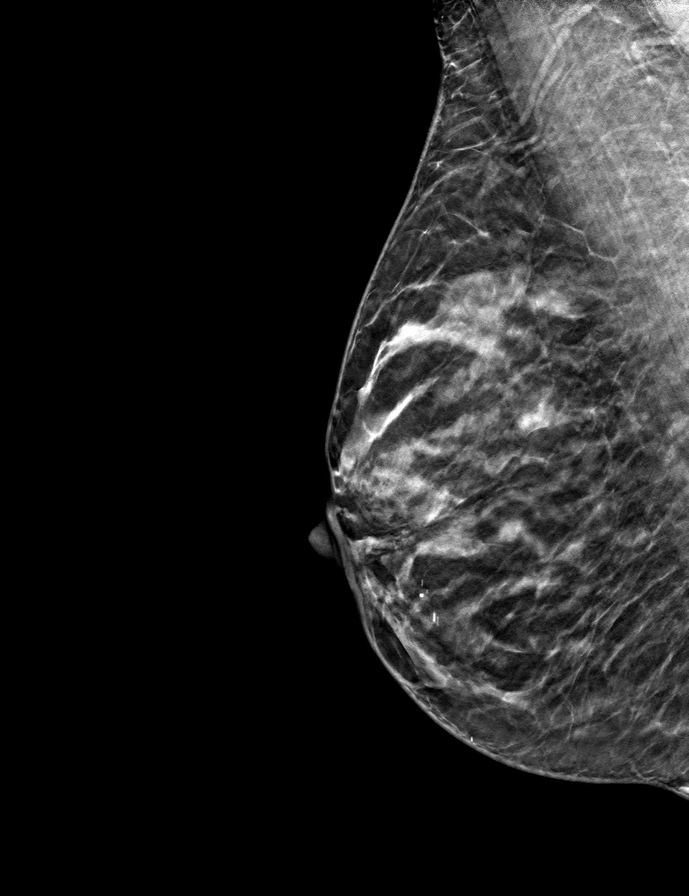
[frame 23/46]
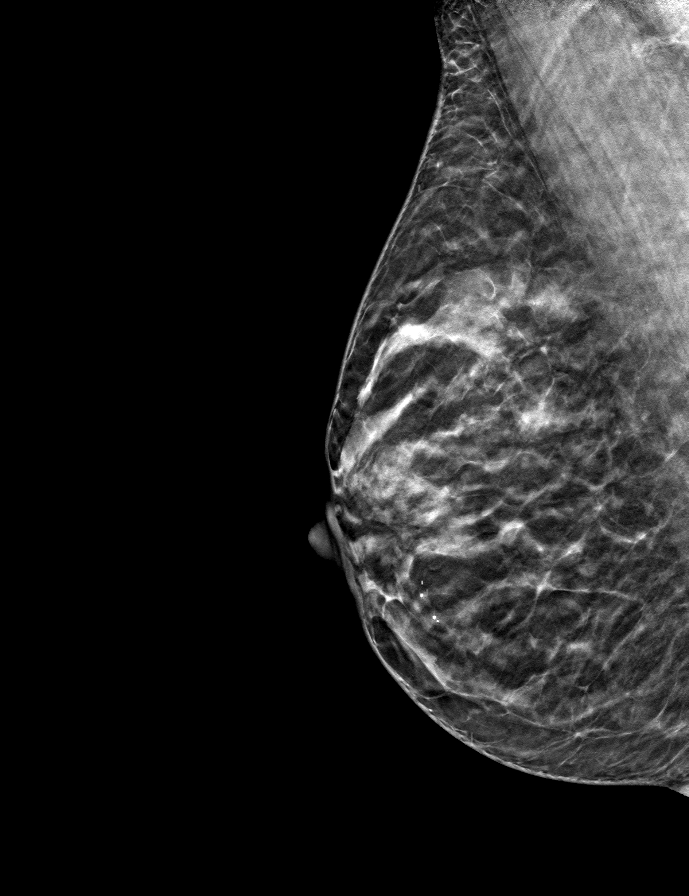

[L CC tomo · tomo slice 25/49.0]
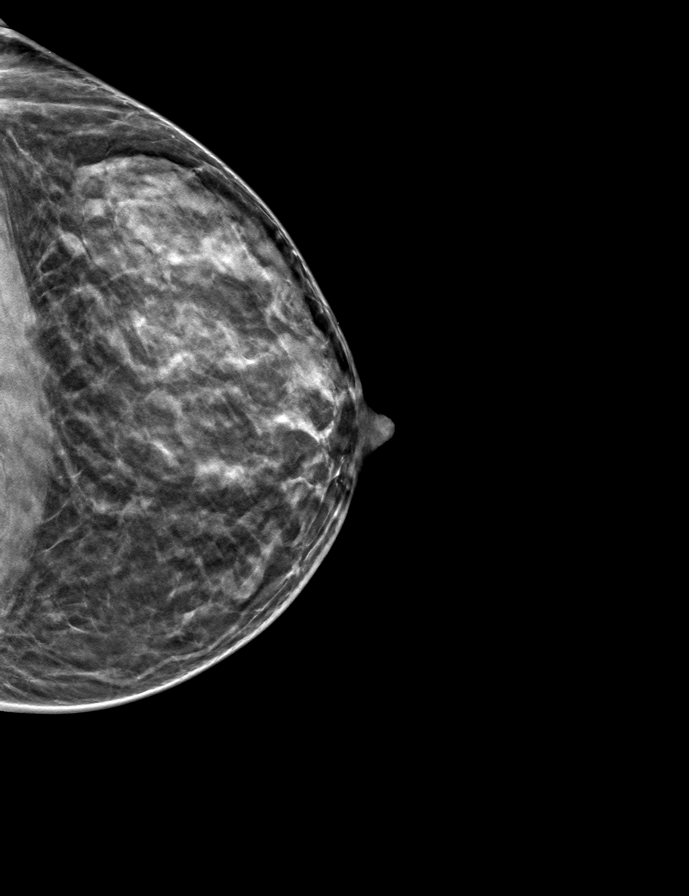

[R CC tomo · tomo slice 26/51.0]
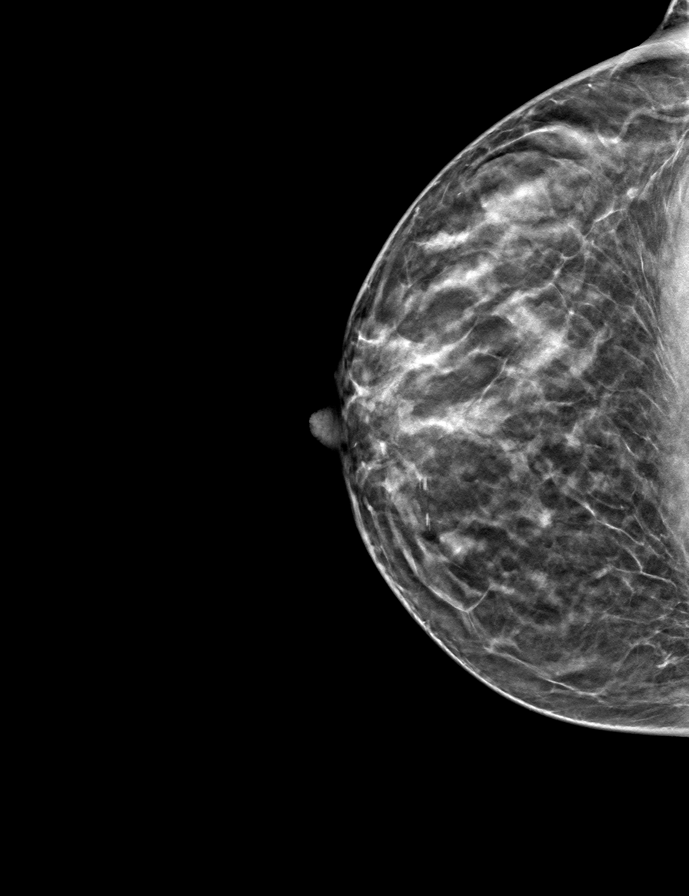

[L MLO tomo · tomo slice 24/47.0]
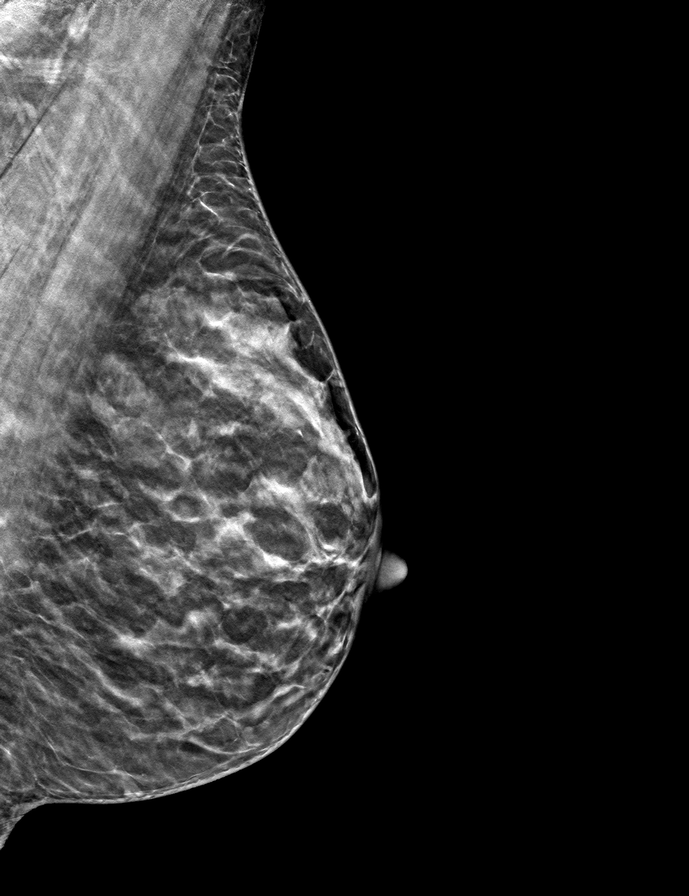

[9 of 24 positions shown; findings below may reference images not displayed]

ACR Breast Density Category c: The breast tissue is heterogeneously
dense, which may obscure small masses.
FINDINGS: There are no findings suspicious for malignancy.
IMPRESSION: No mammographic evidence of malignancy. A result letter of this
screening mammogram will be mailed directly to the patient.

RECOMMENDATION:
Screening mammogram in one year. (Code:[id number])

BI-RADS CATEGORY  1: Negative.

## 2024-08-03 ENCOUNTER — Other Ambulatory Visit: Payer: Self-pay | Admitting: Obstetrics and Gynecology

## 2024-08-03 DIAGNOSIS — Z1231 Encounter for screening mammogram for malignant neoplasm of breast: Secondary | ICD-10-CM

## 2024-08-25 ENCOUNTER — Other Ambulatory Visit: Payer: Self-pay | Admitting: Medical Genetics

## 2024-08-28 ENCOUNTER — Ambulatory Visit
Admission: RE | Admit: 2024-08-28 | Discharge: 2024-08-28 | Disposition: A | Source: Ambulatory Visit | Attending: Obstetrics and Gynecology | Admitting: Obstetrics and Gynecology

## 2024-08-28 DIAGNOSIS — Z1231 Encounter for screening mammogram for malignant neoplasm of breast: Secondary | ICD-10-CM

## 2024-08-31 ENCOUNTER — Ambulatory Visit

## 2024-08-31 DIAGNOSIS — Z1231 Encounter for screening mammogram for malignant neoplasm of breast: Secondary | ICD-10-CM

## 2024-10-01 ENCOUNTER — Other Ambulatory Visit (HOSPITAL_COMMUNITY)
